# Patient Record
Sex: Male | Born: 1960 | Race: White | Hispanic: No | Marital: Married | Smoking: Former smoker
Health system: Southern US, Community
[De-identification: ages and names within clinical notes are randomized; demographics above are authoritative.]

## PROBLEM LIST (undated history)

## (undated) DIAGNOSIS — H532 Diplopia: Secondary | ICD-10-CM

## (undated) DIAGNOSIS — M62838 Other muscle spasm: Secondary | ICD-10-CM

## (undated) DIAGNOSIS — E119 Type 2 diabetes mellitus without complications: Secondary | ICD-10-CM

## (undated) DIAGNOSIS — G35 Multiple sclerosis: Secondary | ICD-10-CM

## (undated) HISTORY — DX: Other muscle spasm: M62.838

## (undated) HISTORY — DX: Multiple sclerosis: G35

## (undated) HISTORY — DX: Diplopia: H53.2

## (undated) HISTORY — PX: APPENDECTOMY: SHX54

## (undated) HISTORY — DX: Type 2 diabetes mellitus without complications: E11.9

## (undated) HISTORY — PX: OTHER SURGICAL HISTORY: SHX169

---

## 1989-05-23 DIAGNOSIS — G35 Multiple sclerosis: Secondary | ICD-10-CM

## 1989-05-23 HISTORY — DX: Multiple sclerosis: G35

## 2014-09-19 DIAGNOSIS — E119 Type 2 diabetes mellitus without complications: Secondary | ICD-10-CM | POA: Diagnosis not present

## 2014-10-01 DIAGNOSIS — G35 Multiple sclerosis: Secondary | ICD-10-CM | POA: Diagnosis not present

## 2014-10-01 DIAGNOSIS — E119 Type 2 diabetes mellitus without complications: Secondary | ICD-10-CM | POA: Diagnosis not present

## 2014-10-01 DIAGNOSIS — Z1389 Encounter for screening for other disorder: Secondary | ICD-10-CM | POA: Diagnosis not present

## 2014-10-27 DIAGNOSIS — G35 Multiple sclerosis: Secondary | ICD-10-CM | POA: Diagnosis not present

## 2014-10-27 DIAGNOSIS — G629 Polyneuropathy, unspecified: Secondary | ICD-10-CM | POA: Diagnosis not present

## 2014-12-01 DIAGNOSIS — E119 Type 2 diabetes mellitus without complications: Secondary | ICD-10-CM | POA: Diagnosis not present

## 2014-12-01 DIAGNOSIS — G35 Multiple sclerosis: Secondary | ICD-10-CM | POA: Diagnosis not present

## 2015-04-02 DIAGNOSIS — E119 Type 2 diabetes mellitus without complications: Secondary | ICD-10-CM | POA: Diagnosis not present

## 2015-04-02 DIAGNOSIS — G35 Multiple sclerosis: Secondary | ICD-10-CM | POA: Diagnosis not present

## 2015-04-02 DIAGNOSIS — R634 Abnormal weight loss: Secondary | ICD-10-CM | POA: Diagnosis not present

## 2015-08-12 DIAGNOSIS — E119 Type 2 diabetes mellitus without complications: Secondary | ICD-10-CM | POA: Diagnosis not present

## 2015-08-12 DIAGNOSIS — G35 Multiple sclerosis: Secondary | ICD-10-CM | POA: Diagnosis not present

## 2015-08-12 DIAGNOSIS — R634 Abnormal weight loss: Secondary | ICD-10-CM | POA: Diagnosis not present

## 2015-08-20 DIAGNOSIS — E119 Type 2 diabetes mellitus without complications: Secondary | ICD-10-CM | POA: Diagnosis not present

## 2015-08-20 DIAGNOSIS — G35 Multiple sclerosis: Secondary | ICD-10-CM | POA: Diagnosis not present

## 2015-08-20 DIAGNOSIS — Z6825 Body mass index (BMI) 25.0-25.9, adult: Secondary | ICD-10-CM | POA: Diagnosis not present

## 2015-12-29 DIAGNOSIS — E119 Type 2 diabetes mellitus without complications: Secondary | ICD-10-CM | POA: Diagnosis not present

## 2015-12-29 DIAGNOSIS — R634 Abnormal weight loss: Secondary | ICD-10-CM | POA: Diagnosis not present

## 2015-12-31 DIAGNOSIS — Z1389 Encounter for screening for other disorder: Secondary | ICD-10-CM | POA: Diagnosis not present

## 2015-12-31 DIAGNOSIS — E119 Type 2 diabetes mellitus without complications: Secondary | ICD-10-CM | POA: Diagnosis not present

## 2015-12-31 DIAGNOSIS — Z6824 Body mass index (BMI) 24.0-24.9, adult: Secondary | ICD-10-CM | POA: Diagnosis not present

## 2015-12-31 DIAGNOSIS — G35 Multiple sclerosis: Secondary | ICD-10-CM | POA: Diagnosis not present

## 2016-05-11 DIAGNOSIS — G35 Multiple sclerosis: Secondary | ICD-10-CM | POA: Diagnosis not present

## 2016-05-11 DIAGNOSIS — E119 Type 2 diabetes mellitus without complications: Secondary | ICD-10-CM | POA: Diagnosis not present

## 2016-05-11 DIAGNOSIS — Z6824 Body mass index (BMI) 24.0-24.9, adult: Secondary | ICD-10-CM | POA: Diagnosis not present

## 2016-08-31 DIAGNOSIS — R634 Abnormal weight loss: Secondary | ICD-10-CM | POA: Diagnosis not present

## 2016-08-31 DIAGNOSIS — G35 Multiple sclerosis: Secondary | ICD-10-CM | POA: Diagnosis not present

## 2016-08-31 DIAGNOSIS — E119 Type 2 diabetes mellitus without complications: Secondary | ICD-10-CM | POA: Diagnosis not present

## 2016-09-02 DIAGNOSIS — E119 Type 2 diabetes mellitus without complications: Secondary | ICD-10-CM | POA: Diagnosis not present

## 2016-09-09 DIAGNOSIS — E119 Type 2 diabetes mellitus without complications: Secondary | ICD-10-CM | POA: Diagnosis not present

## 2016-09-09 DIAGNOSIS — H527 Unspecified disorder of refraction: Secondary | ICD-10-CM | POA: Diagnosis not present

## 2016-09-09 DIAGNOSIS — H4921 Sixth [abducent] nerve palsy, right eye: Secondary | ICD-10-CM | POA: Diagnosis not present

## 2016-09-09 DIAGNOSIS — H532 Diplopia: Secondary | ICD-10-CM | POA: Diagnosis not present

## 2016-09-12 ENCOUNTER — Other Ambulatory Visit: Payer: Self-pay | Admitting: Ophthalmology

## 2016-09-12 DIAGNOSIS — G35 Multiple sclerosis: Secondary | ICD-10-CM

## 2016-09-12 DIAGNOSIS — H532 Diplopia: Secondary | ICD-10-CM

## 2016-09-12 DIAGNOSIS — H4921 Sixth [abducent] nerve palsy, right eye: Secondary | ICD-10-CM

## 2016-09-12 DIAGNOSIS — E119 Type 2 diabetes mellitus without complications: Secondary | ICD-10-CM | POA: Diagnosis not present

## 2016-09-12 DIAGNOSIS — Z1389 Encounter for screening for other disorder: Secondary | ICD-10-CM | POA: Diagnosis not present

## 2016-09-26 ENCOUNTER — Ambulatory Visit (INDEPENDENT_AMBULATORY_CARE_PROVIDER_SITE_OTHER): Payer: Medicare Other | Admitting: Neurology

## 2016-09-26 ENCOUNTER — Encounter: Payer: Self-pay | Admitting: Neurology

## 2016-09-26 ENCOUNTER — Encounter (INDEPENDENT_AMBULATORY_CARE_PROVIDER_SITE_OTHER): Payer: Self-pay

## 2016-09-26 DIAGNOSIS — G35 Multiple sclerosis: Secondary | ICD-10-CM | POA: Diagnosis not present

## 2016-09-26 NOTE — Progress Notes (Signed)
PATIENT: Steven Buchanan DOB: 06-02-1960  Chief Complaint  Patient presents with  . MS/Diplopia    States he was diagnosed with MS in 1991 but has never been on any medications. Reports new onset of double vision three weeks ago that has improved since taking a six day Prednisone dose pack.  Marland Kitchen PCP    Lovey Newcomer, PA  . Optometry    Smitty Cords, OD     HISTORICAL  Steven Buchanan 56 years old right-handed male, seen in refer by his ophthalmologist Dr. Smitty Cords for evaluation of double vision, his primary care physician is PA Daria Pastures. initial evaluation is on Sep 26 2016.  He had a past medical history of relapsing remitting multiple sclerosis, diagnosis was made in 1988, he presented with numbness from chest down, needle prick sensation at bilateral lower extremity, mild gait abnormality, mild bilateral hands paresthesia. Per patient, there was abnormal MRI of the cervical, and thoracic spine, patient decided not to take any long term immunomodulation therapy.  Over the past 30 years, he had occasionally flareup, usually it is similar symptoms of paresthesia from trunk down, lasting 1-2 weeks, from 2008 to 2012, he was followed by local neurologist Dr.Tesafay, per patient, he was treated with one round of by mouth steroid on a yearly basis because his flareups,  He never had visual problems associated with his MS, intermittent of April 2018, while driving his mother to [place], he noticed double vision, worse looking to the right and straight ahead, best looking to the left side, he self treated with tapering dose of prednisone with some improvement, at the same time, he notice recurrent bilateral lower abdomen muscle spasm paresthesia, increased bilateral lower extremity paresthesia, he denies bowel and bladder incontinence.  Patient reported normal laboratory evaluation recently, he was diagnosed with diabetes type 2 since 2015 is under good control.   REVIEW OF SYSTEMS:  Full 14 system review of systems performed and notable only for double vision, feeling hot, headache, numbness, weakness.   ALLERGIES: Allergies  Allergen Reactions  . Benadryl [Diphenhydramine]     tremors    HOME MEDICATIONS: Current Outpatient Prescriptions  Medication Sig Dispense Refill  . gabapentin (NEURONTIN) 100 MG capsule Take 100 mg by mouth 2 (two) times daily.  1  . metFORMIN (GLUCOPHAGE-XR) 500 MG 24 hr tablet Take 1,000 mg by mouth daily.  3  . omeprazole (PRILOSEC) 20 MG capsule Take 20 mg by mouth daily.    Marland Kitchen tiZANidine (ZANAFLEX) 4 MG capsule Take 4 mg by mouth as needed for muscle spasms.    . traMADol (ULTRAM) 50 MG tablet Take 50 mg by mouth as needed.  0   No current facility-administered medications for this visit.     PAST MEDICAL HISTORY: Past Medical History:  Diagnosis Date  . Diabetes (HCC)   . Double vision   . Multiple sclerosis (HCC) 1991  . Muscle spasm     PAST SURGICAL HISTORY: Past Surgical History:  Procedure Laterality Date  . APPENDECTOMY    . kidney stones      FAMILY HISTORY: Family History  Problem Relation Age of Onset  . Leukemia Mother   . Alzheimer's disease Father   . Diabetes Father     SOCIAL HISTORY:  Social History   Social History  . Marital status: Married    Spouse name: N/A  . Number of children: 4  . Years of education: some college   Occupational History  . Disabled  Social History Main Topics  . Smoking status: Former Games developer  . Smokeless tobacco: Never Used     Comment: Quit 20+ years ago  . Alcohol use Yes     Comment: Casual beer  . Drug use: No  . Sexual activity: Not on file   Other Topics Concern  . Not on file   Social History Narrative   Lives at home with wife, children and his mother.   Right-handed.   1 cup caffeine per day.     PHYSICAL EXAM   Vitals:   09/26/16 1604  BP: 139/79  Pulse: 73  Weight: 147 lb 4 oz (66.8 kg)  Height: 5\' 5"  (1.651 m)    Not recorded       Body mass index is 24.5 kg/m.  PHYSICAL EXAMNIATION:  Gen: NAD, conversant, well nourised, obese, well groomed                     Cardiovascular: Regular rate rhythm, no peripheral edema, warm, nontender. Eyes: Conjunctivae clear without exudates or hemorrhage Neck: Supple, no carotid bruits. Pulmonary: Clear to auscultation bilaterally   NEUROLOGICAL EXAM:  MENTAL STATUS: Speech:    Speech is normal; fluent and spontaneous with normal comprehension.  Cognition:     Orientation to time, place and person     Normal recent and remote memory     Normal Attention span and concentration     Normal Language, naming, repeating,spontaneous speech     Fund of knowledge   CRANIAL NERVES: CN II: Visual fields are full to confrontation. Fundoscopic exam is normal with sharp discs and no vascular changes. Pupils are round equal and briskly reactive to light. CN III, IV, VI: extraocular movement are normal. No ptosis. CN V: Facial sensation is intact to pinprick in all 3 divisions bilaterally. Corneal responses are intact.  CN VII: Face is symmetric with normal eye closure and smile. CN VIII: Hearing is normal to rubbing fingers CN IX, X: Palate elevates symmetrically. Phonation is normal. CN XI: Head turning and shoulder shrug are intact CN XII: Tongue is midline with normal movements and no atrophy.  MOTOR: There is no pronator drift of out-stretched arms. Muscle bulk and tone are normal. Muscle strength is normal.  REFLEXES: Reflexes are 2+ and symmetric at the biceps, triceps,Decreased at bilateral  knees, and ankles. Plantar responses are flexor.  SENSORY: Intact to light touch, pinprick, positional sensation and vibratory sensation are intact in fingers and toes.  COORDINATION: Rapid alternating movements and fine finger movements are intact. There is no dysmetria on finger-to-nose and heel-knee-shin.    GAIT/STANCE: Posture is normal. Gait is steady with normal steps,  base, arm swing, and turning. Heel and toe walking are normal. Tandem gait is normal.  Romberg is absent.   DIAGNOSTIC DATA (LABS, IMAGING, TESTING) - I reviewed patient records, labs, notes, testing and imaging myself where available.   ASSESSMENT AND PLAN  Marzell Isakson is a 56 y.o. male   Relapsing remitting multiple sclerosis  MRI of the brain, cervical and thoracic spine with and without contrast,  Will return to clinic in 3 -4 weeks, may consider long-term immunomodulation therapy   Levert Feinstein, M.D. Ph.D.  [facility] [address], [address] [place], [place] [address] Ph: [phone] Fax: [phone]  CC: Referring Provider

## 2016-09-28 ENCOUNTER — Telehealth: Payer: Self-pay | Admitting: Neurology

## 2016-09-28 NOTE — Telephone Encounter (Signed)
I scheduled his MRI's for 10/05/16 at 10/07/16 at [facility].. And they informed me that Dr. Terrace Arabia needs to e-sign the orders.Marland Kitchen

## 2016-09-28 NOTE — Telephone Encounter (Signed)
Noted, thank you

## 2016-10-05 ENCOUNTER — Ambulatory Visit (HOSPITAL_COMMUNITY)
Admission: RE | Admit: 2016-10-05 | Discharge: 2016-10-05 | Disposition: A | Discharge status: Home / Self Care | Payer: Medicare Other | Source: Ambulatory Visit | Attending: Neurology | Admitting: Neurology

## 2016-10-05 ENCOUNTER — Telehealth: Payer: Self-pay | Admitting: Neurology

## 2016-10-05 DIAGNOSIS — M4802 Spinal stenosis, cervical region: Secondary | ICD-10-CM | POA: Insufficient documentation

## 2016-10-05 DIAGNOSIS — G35 Multiple sclerosis: Secondary | ICD-10-CM | POA: Diagnosis not present

## 2016-10-05 DIAGNOSIS — R5383 Other fatigue: Secondary | ICD-10-CM | POA: Diagnosis not present

## 2016-10-05 LAB — POCT I-STAT CREATININE: Creatinine, Ser: 0.8 mg/dL (ref 0.61–1.24)

## 2016-10-05 MED ORDER — GADOBENATE DIMEGLUMINE 529 MG/ML IV SOLN
15.0000 mL | Freq: Once | INTRAVENOUS | Status: AC | PRN
  Administered 2016-10-05: 13 mL via INTRAVENOUS

## 2016-10-05 NOTE — Telephone Encounter (Signed)
Please call patient, MRI of the brain showed stable MS lesion, MRI of the cervical and upper thoracic spine showed chronic demyelinating lesions, Keep follow-up appointment will review MRIs, discussed treatment options.    1. No acute cervical or upper thoracic spinal cord demyelination identified. Stable to mild progression of chronic focal spinal cord lesions at C2-C3 and C7-T1 since 2008. 2. 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 3. Superimposed cervical spine disc degeneration. 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

## 2016-10-05 NOTE — Telephone Encounter (Signed)
Spoke to patient - he is aware of results and will keep his pending appt on 10/18/16 for further review.

## 2016-10-07 ENCOUNTER — Ambulatory Visit (HOSPITAL_COMMUNITY): Payer: Medicare Other

## 2016-10-18 ENCOUNTER — Encounter: Payer: Self-pay | Admitting: Neurology

## 2016-10-18 ENCOUNTER — Ambulatory Visit (INDEPENDENT_AMBULATORY_CARE_PROVIDER_SITE_OTHER): Payer: Medicare Other | Admitting: Neurology

## 2016-10-18 VITALS — BP 116/69 | HR 59 | Ht 65.0 in | Wt 146.0 lb

## 2016-10-18 DIAGNOSIS — E538 Deficiency of other specified B group vitamins: Secondary | ICD-10-CM | POA: Diagnosis not present

## 2016-10-18 DIAGNOSIS — G35 Multiple sclerosis: Secondary | ICD-10-CM | POA: Diagnosis not present

## 2016-10-18 DIAGNOSIS — R799 Abnormal finding of blood chemistry, unspecified: Secondary | ICD-10-CM | POA: Diagnosis not present

## 2016-10-18 DIAGNOSIS — E559 Vitamin D deficiency, unspecified: Secondary | ICD-10-CM | POA: Diagnosis not present

## 2016-10-18 DIAGNOSIS — R52 Pain, unspecified: Secondary | ICD-10-CM | POA: Diagnosis not present

## 2016-10-18 MED ORDER — TIZANIDINE HCL 4 MG PO CAPS: 4.0000 mg | ORAL_CAPSULE | ORAL | 6 refills | Status: AC | PRN

## 2016-10-18 NOTE — Patient Instructions (Signed)
[email or web address]  Gilenya (fingolimod) Tecfidera (dimethyl fumarate)

## 2016-10-18 NOTE — Progress Notes (Signed)
PATIENT: Steven Buchanan DOB: [birth year]  Chief Complaint  Patient presents with  . Multiple Sclerosis    He is here to review his MRI results.     HISTORICAL  Steven Buchanan 56 years old right-handed male, seen in refer by his ophthalmologist Dr. Daphine Deutscher, Amalia Hailey for evaluation of double vision, his primary care physician is PA Daria Pastures. initial evaluation is on Sep 26 2016.  He had a past medical history of relapsing remitting multiple sclerosis, diagnosis was made in 1988, he presented with numbness from chest down, needle prick sensation at bilateral lower extremity, mild gait abnormality, mild bilateral hands paresthesia. Per patient, there was abnormal MRI of the cervical, and thoracic spine, patient decided not to take any long term immunomodulation therapy.  Over the past 30 years, he had occasionally flareup, usually it is similar symptoms of paresthesia from trunk down, lasting 1-2 weeks, from 2008 to 2012, he was followed by local neurologist Dr.Tesafay, per patient, he was treated with one round of by mouth steroid on a yearly basis because his flareups,  He never had visual problems associated with his MS, intermittent of April 2018, while driving his mother to [place], he noticed double vision, worse looking to the right and straight ahead, best looking to the left side, he self treated with tapering dose of prednisone with some improvement, at the same time, he notice recurrent bilateral lower abdomen muscle spasm paresthesia, increased bilateral lower extremity paresthesia, he denies bowel and bladder incontinence.  Patient reported normal laboratory evaluation recently, he was diagnosed with diabetes type 2 since 2015 is under good control.  Updated Oct 18 2013:  His double vision has improved, he is now back to his baseline,  We have personally reviewed MRI of the brain with and without contrast on Oct 05 2016: Scattered nonspecific white matter signal abnormality,  stable without progression since 2008, no enhancement.  MRI of cervical spine with and without contrast showed mild progression of chronic focal spinal cord lesion at C2-3, C7-T1 since 2000 oh 7, superimposed bilateral abnormally thickened and enhancing cervical nerve roots and nerves, MRI of thoracic spine, Patchy heterogeneity within the thoracic spinal cord ,evidence of superimposed a small vascular malformation along surface of lower thoracic spinal cord at T10 and 11, possible abnormal thickening of proximal pulmonary crater nerve roots,   REVIEW OF SYSTEMS: Full 14 system review of systems performed and notable only for headache, numbness  ALLERGIES: Allergies  Allergen Reactions  . Benadryl [Diphenhydramine]     tremors    HOME MEDICATIONS: Current Outpatient Prescriptions  Medication Sig Dispense Refill  . gabapentin (NEURONTIN) 100 MG capsule Take 100 mg by mouth 2 (two) times daily.  1  . metFORMIN (GLUCOPHAGE-XR) 500 MG 24 hr tablet Take 1,000 mg by mouth daily.  3  . omeprazole (PRILOSEC) 20 MG capsule Take 20 mg by mouth daily.    Marland Kitchen tiZANidine (ZANAFLEX) 4 MG capsule Take 4 mg by mouth as needed for muscle spasms.    . traMADol (ULTRAM) 50 MG tablet Take 50 mg by mouth as needed.  0   No current facility-administered medications for this visit.     PAST MEDICAL HISTORY: Past Medical History:  Diagnosis Date  . Diabetes (HCC)   . Double vision   . Multiple sclerosis (HCC) 1991  . Muscle spasm     PAST SURGICAL HISTORY: Past Surgical History:  Procedure Laterality Date  . APPENDECTOMY    . kidney stones  FAMILY HISTORY: Family History  Problem Relation Age of Onset  . Leukemia Mother   . Alzheimer's disease Father   . Diabetes Father     SOCIAL HISTORY:  Social History   Social History  . Marital status: Married    Spouse name: N/A  . Number of children: 4  . Years of education: some college   Occupational History  . Disabled    Social  History Main Topics  . Smoking status: Former Games developer  . Smokeless tobacco: Never Used     Comment: Quit 20+ years ago  . Alcohol use Yes     Comment: Casual beer  . Drug use: No  . Sexual activity: Not on file   Other Topics Concern  . Not on file   Social History Narrative   Lives at home with wife, children and his mother.   Right-handed.   1 cup caffeine per day.     PHYSICAL EXAM   Vitals:   10/18/16 1201  BP: 116/69  Pulse: (!) 59  Weight: 146 lb (66.2 kg)  Height: 5\' 5"  (1.651 m)    Not recorded      Body mass index is 24.3 kg/m.  PHYSICAL EXAMNIATION:  Gen: NAD, conversant, well nourised, obese, well groomed                     Cardiovascular: Regular rate rhythm, no peripheral edema, warm, nontender. Eyes: Conjunctivae clear without exudates or hemorrhage Neck: Supple, no carotid bruits. Pulmonary: Clear to auscultation bilaterally   NEUROLOGICAL EXAM:  MENTAL STATUS: Speech:    Speech is normal; fluent and spontaneous with normal comprehension.  Cognition:     Orientation to time, place and person     Normal recent and remote memory     Normal Attention span and concentration     Normal Language, naming, repeating,spontaneous speech     Fund of knowledge   CRANIAL NERVES: CN II: Visual fields are full to confrontation. Fundoscopic exam is normal with sharp discs and no vascular changes. Pupils are round equal and briskly reactive to light. CN III, IV, VI: extraocular movement are normal. No ptosis. CN V: Facial sensation is intact to pinprick in all 3 divisions bilaterally. Corneal responses are intact.  CN VII: Face is symmetric with normal eye closure and smile. CN VIII: Hearing is normal to rubbing fingers CN IX, X: Palate elevates symmetrically. Phonation is normal. CN XI: Head turning and shoulder shrug are intact CN XII: Tongue is midline with normal movements and no atrophy.  MOTOR: There is no pronator drift of out-stretched arms.  Muscle bulk and tone are normal. Muscle strength is normal.  REFLEXES: Reflexes are 2+ and symmetric at the biceps, triceps,Decreased at bilateral  knees, and ankles. Plantar responses are flexor.  SENSORY: Intact to light touch, pinprick, positional sensation and vibratory sensation are intact in fingers and toes.  COORDINATION: Rapid alternating movements and fine finger movements are intact. There is no dysmetria on finger-to-nose and heel-knee-shin.    GAIT/STANCE: Posture is normal. Gait is steady with normal steps, base, arm swing, and turning. Heel and toe walking are normal. Tandem gait is normal.  Romberg is absent.   DIAGNOSTIC DATA (LABS, IMAGING, TESTING) - I reviewed patient records, labs, notes, testing and imaging myself where available.   ASSESSMENT AND PLAN  Steven Buchanan is a 56 y.o. male   Relapsing remitting multiple sclerosis  Continued evidence of abnormal MRI of the brain, cervical, thoracic spine,  also evidence of combined spinal cord lesion and peripheral nerve root abnormal thickening  Proceed with spinal fluid testing  Laboratory evaluations  I also provide information of potential treatment option Gilenya and Tecfidera     Levert Feinstein, M.D. Ph.D.  [facility] [address] [place], [place] [address] Ph: [phone] Fax: [phone]  CC: Referring Provider

## 2016-10-20 LAB — COMPREHENSIVE METABOLIC PANEL
A/G RATIO: 1.5 (ref 1.2–2.2)
ALBUMIN: 4.3 g/dL (ref 3.5–5.5)
ALT: 22 IU/L (ref 0–44)
AST: 23 IU/L (ref 0–40)
Alkaline Phosphatase: 49 IU/L (ref 39–117)
BILIRUBIN TOTAL: 0.4 mg/dL (ref 0.0–1.2)
BUN / CREAT RATIO: 18 (ref 9–20)
BUN: 14 mg/dL (ref 6–24)
CALCIUM: 9.1 mg/dL (ref 8.7–10.2)
CO2: 26 mmol/L (ref 18–29)
Chloride: 104 mmol/L (ref 96–106)
Creatinine, Ser: 0.8 mg/dL (ref 0.76–1.27)
GFR calc non Af Amer: 98 mL/min/{1.73_m2} (ref >59)
GFR, EST AFRICAN AMERICAN: 114 mL/min/{1.73_m2} (ref >59)
Globulin, Total: 2.9 g/dL (ref 1.5–4.5)
Glucose: 103 mg/dL — ABNORMAL HIGH (ref 65–99)
Potassium: 4.9 mmol/L (ref 3.5–5.2)
Sodium: 144 mmol/L (ref 134–144)

## 2016-10-20 LAB — QUANTIFERON IN TUBE
QFT TB AG MINUS NIL VALUE: 0.01 IU/mL
QUANTIFERON MITOGEN VALUE: 7.5 IU/mL
QUANTIFERON NIL VALUE: 0.03 [IU]/mL
QUANTIFERON TB AG VALUE: 0.04 IU/mL
QUANTIFERON TB GOLD: NEGATIVE

## 2016-10-20 LAB — CBC WITH DIFFERENTIAL
BASOS: 1 %
Basophils Absolute: 0 10*3/uL (ref 0.0–0.2)
EOS (ABSOLUTE): 0.1 10*3/uL (ref 0.0–0.4)
EOS: 2 %
HEMATOCRIT: 45.6 % (ref 37.5–51.0)
Hemoglobin: 16.1 g/dL (ref 13.0–17.7)
IMMATURE GRANS (ABS): 0 10*3/uL (ref 0.0–0.1)
Immature Granulocytes: 0 %
LYMPHS: 25 %
Lymphocytes Absolute: 1.6 10*3/uL (ref 0.7–3.1)
MCH: 32.2 pg (ref 26.6–33.0)
MCHC: 35.3 g/dL (ref 31.5–35.7)
MCV: 91 fL (ref 79–97)
MONOCYTES: 10 %
Monocytes Absolute: 0.6 10*3/uL (ref 0.1–0.9)
NEUTROS PCT: 62 %
Neutrophils Absolute: 3.9 10*3/uL (ref 1.4–7.0)
RBC: 5 x10E6/uL (ref 4.14–5.80)
RDW: 13.3 % (ref 12.3–15.4)
WBC: 6.2 10*3/uL (ref 3.4–10.8)

## 2016-10-20 LAB — VITAMIN D 25 HYDROXY (VIT D DEFICIENCY, FRACTURES): VIT D 25 HYDROXY: 26.1 ng/mL — AB (ref 30.0–100.0)

## 2016-10-20 LAB — SEDIMENTATION RATE: SED RATE: 2 mm/h (ref 0–30)

## 2016-10-20 LAB — NEUROMYELITIS OPTICA AUTOAB, IGG: NMO IgG Autoantibodies: <1.5 U/mL (ref 0.0–3.0)

## 2016-10-20 LAB — RPR: RPR: NONREACTIVE

## 2016-10-20 LAB — C-REACTIVE PROTEIN: CRP: 1.1 mg/L (ref 0.0–4.9)

## 2016-10-20 LAB — B. BURGDORFI ANTIBODIES: Lyme IgG/IgM Ab: <0.91 {ISR} (ref 0.00–0.90)

## 2016-10-20 LAB — IMMUNOFIXATION ELECTROPHORESIS
IGG (IMMUNOGLOBIN G), SERUM: 1130 mg/dL (ref 700–1600)
IgA/Immunoglobulin A, Serum: 408 mg/dL — ABNORMAL HIGH (ref 90–386)
IgM (Immunoglobulin M), Srm: 95 mg/dL (ref 20–172)
Total Protein: 7.2 g/dL (ref 6.0–8.5)

## 2016-10-20 LAB — TSH: TSH: 1.87 u[IU]/mL (ref 0.450–4.500)

## 2016-10-20 LAB — HEPATITIS C ANTIBODY: Hep C Virus Ab: <0.1 s/co ratio (ref 0.0–0.9)

## 2016-10-20 LAB — QUANTIFERON TB GOLD ASSAY (BLOOD)

## 2016-10-20 LAB — HIV ANTIBODY (ROUTINE TESTING W REFLEX): HIV SCREEN 4TH GENERATION: NONREACTIVE

## 2016-10-20 LAB — COPPER, SERUM: Copper: 75 ug/dL (ref 72–166)

## 2016-10-20 LAB — VITAMIN B12: VITAMIN B 12: 385 pg/mL (ref 232–1245)

## 2016-10-20 LAB — ANA W/REFLEX: Anti Nuclear Antibody(ANA): NEGATIVE

## 2016-10-20 LAB — CK: Total CK: 204 U/L (ref 24–204)

## 2016-10-20 LAB — VARICELLA ZOSTER ANTIBODY, IGG: VARICELLA: 3630 {index} (ref >165)

## 2016-10-20 LAB — FOLATE: Folate: 16.7 ng/mL (ref >3.0)

## 2016-10-20 LAB — HEPATITIS B SURFACE ANTIBODY,QUALITATIVE: Hep B Surface Ab, Qual: NONREACTIVE

## 2016-10-20 LAB — HEPATITIS B SURFACE ANTIGEN: Hepatitis B Surface Ag: NEGATIVE

## 2016-10-26 ENCOUNTER — Other Ambulatory Visit (INDEPENDENT_AMBULATORY_CARE_PROVIDER_SITE_OTHER): Payer: Self-pay

## 2016-10-26 DIAGNOSIS — Z0289 Encounter for other administrative examinations: Secondary | ICD-10-CM

## 2017-01-24 DIAGNOSIS — R634 Abnormal weight loss: Secondary | ICD-10-CM | POA: Diagnosis not present

## 2017-01-24 DIAGNOSIS — E119 Type 2 diabetes mellitus without complications: Secondary | ICD-10-CM | POA: Diagnosis not present

## 2017-01-24 DIAGNOSIS — G35 Multiple sclerosis: Secondary | ICD-10-CM | POA: Diagnosis not present

## 2017-01-26 DIAGNOSIS — E119 Type 2 diabetes mellitus without complications: Secondary | ICD-10-CM | POA: Diagnosis not present

## 2017-01-26 DIAGNOSIS — G35 Multiple sclerosis: Secondary | ICD-10-CM | POA: Diagnosis not present

## 2017-01-26 DIAGNOSIS — Z6825 Body mass index (BMI) 25.0-25.9, adult: Secondary | ICD-10-CM | POA: Diagnosis not present

## 2017-04-06 ENCOUNTER — Ambulatory Visit: Payer: Medicare Other | Admitting: Neurology

## 2017-05-25 DIAGNOSIS — G35 Multiple sclerosis: Secondary | ICD-10-CM | POA: Diagnosis not present

## 2017-05-25 DIAGNOSIS — R634 Abnormal weight loss: Secondary | ICD-10-CM | POA: Diagnosis not present

## 2017-05-25 DIAGNOSIS — E119 Type 2 diabetes mellitus without complications: Secondary | ICD-10-CM | POA: Diagnosis not present

## 2017-05-31 DIAGNOSIS — E119 Type 2 diabetes mellitus without complications: Secondary | ICD-10-CM | POA: Diagnosis not present

## 2017-05-31 DIAGNOSIS — G35 Multiple sclerosis: Secondary | ICD-10-CM | POA: Diagnosis not present

## 2017-05-31 DIAGNOSIS — Z6824 Body mass index (BMI) 24.0-24.9, adult: Secondary | ICD-10-CM | POA: Diagnosis not present

## 2017-08-08 DIAGNOSIS — L989 Disorder of the skin and subcutaneous tissue, unspecified: Secondary | ICD-10-CM | POA: Diagnosis not present

## 2017-08-08 DIAGNOSIS — Z6825 Body mass index (BMI) 25.0-25.9, adult: Secondary | ICD-10-CM | POA: Diagnosis not present

## 2017-11-24 DIAGNOSIS — L989 Disorder of the skin and subcutaneous tissue, unspecified: Secondary | ICD-10-CM | POA: Diagnosis not present

## 2017-11-24 DIAGNOSIS — G35 Multiple sclerosis: Secondary | ICD-10-CM | POA: Diagnosis not present

## 2017-11-24 DIAGNOSIS — E119 Type 2 diabetes mellitus without complications: Secondary | ICD-10-CM | POA: Diagnosis not present

## 2017-11-24 DIAGNOSIS — R634 Abnormal weight loss: Secondary | ICD-10-CM | POA: Diagnosis not present

## 2017-11-29 DIAGNOSIS — Z0000 Encounter for general adult medical examination without abnormal findings: Secondary | ICD-10-CM | POA: Diagnosis not present

## 2017-11-29 DIAGNOSIS — Z6825 Body mass index (BMI) 25.0-25.9, adult: Secondary | ICD-10-CM | POA: Diagnosis not present

## 2017-11-29 DIAGNOSIS — E119 Type 2 diabetes mellitus without complications: Secondary | ICD-10-CM | POA: Diagnosis not present

## 2018-04-22 IMAGING — MR MR THORACIC SPINE WO/W CM
4 of 9 series · 11 of 48 positions shown · IV contrast (13ml Multihance)
Comparison: Cervical spine MRI from today reported separately.

CLINICAL DATA: 56-year-old male with relapsing remitting multiple
sclerosis. Double vision, fatigue and leg numbness for 1 month.

EXAM:
MRI THORACIC WITHOUT AND WITH CONTRAST
TECHNIQUE: Multiplanar and multiecho pulse sequences of the thoracic spine were
obtained without and with intravenous contrast.
CONTRAST:  13 mL MultiHance in conjunction with contrast enhanced
imaging of the brain and cervical spine reported separately.

[Series 7: T1 · sagittal · 4.0mm · 0.72mm/px · 3 of 13 slices shown (1 of 2)]
[im 1/13]
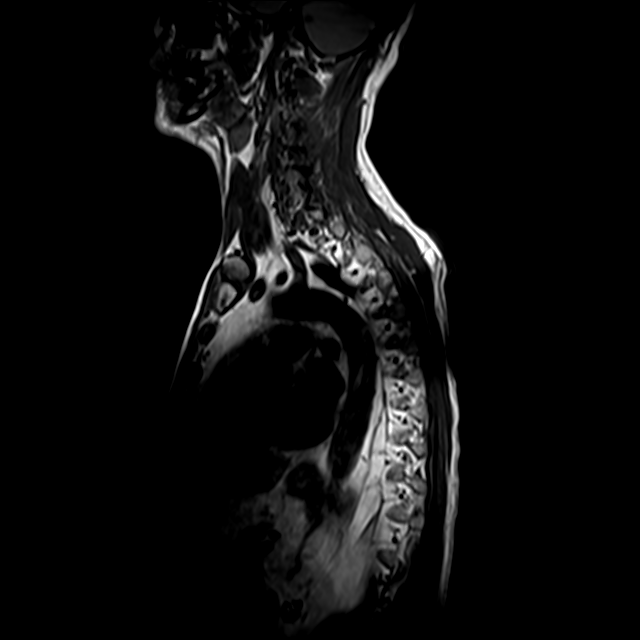
[im 9/13]
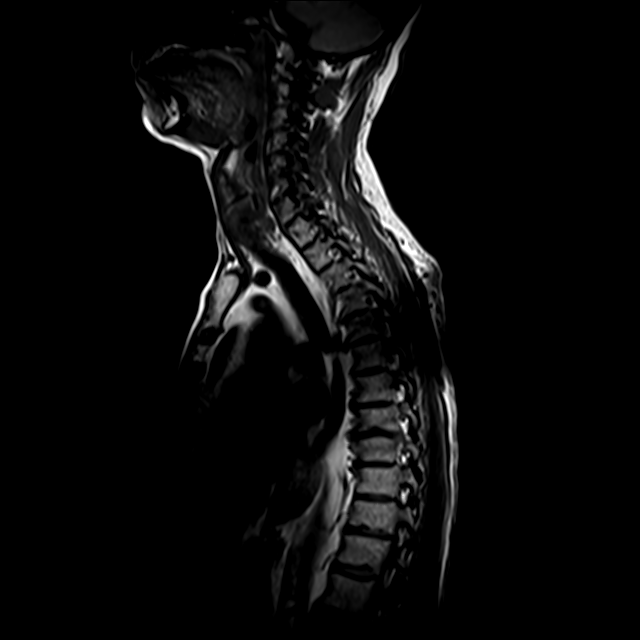
[im 13/13]
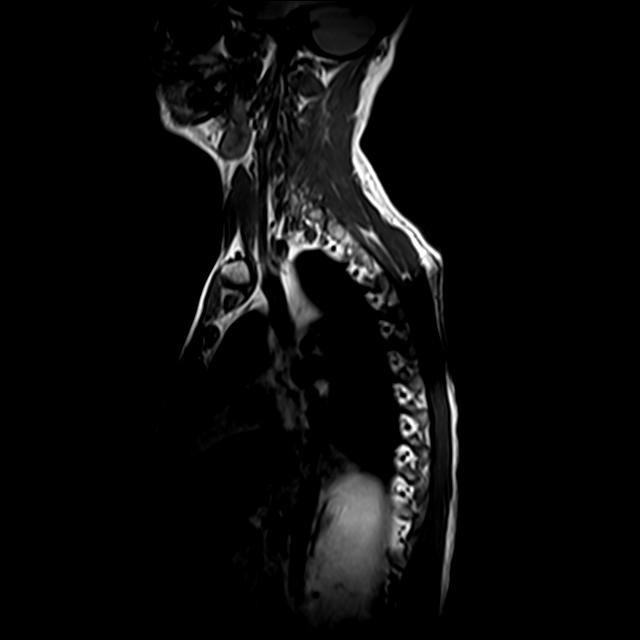

[Series 8: T1 · sagittal · 4.0mm · 0.38mm/px · 2 of 15 slices shown (2 of 2)]
[im 1/15]
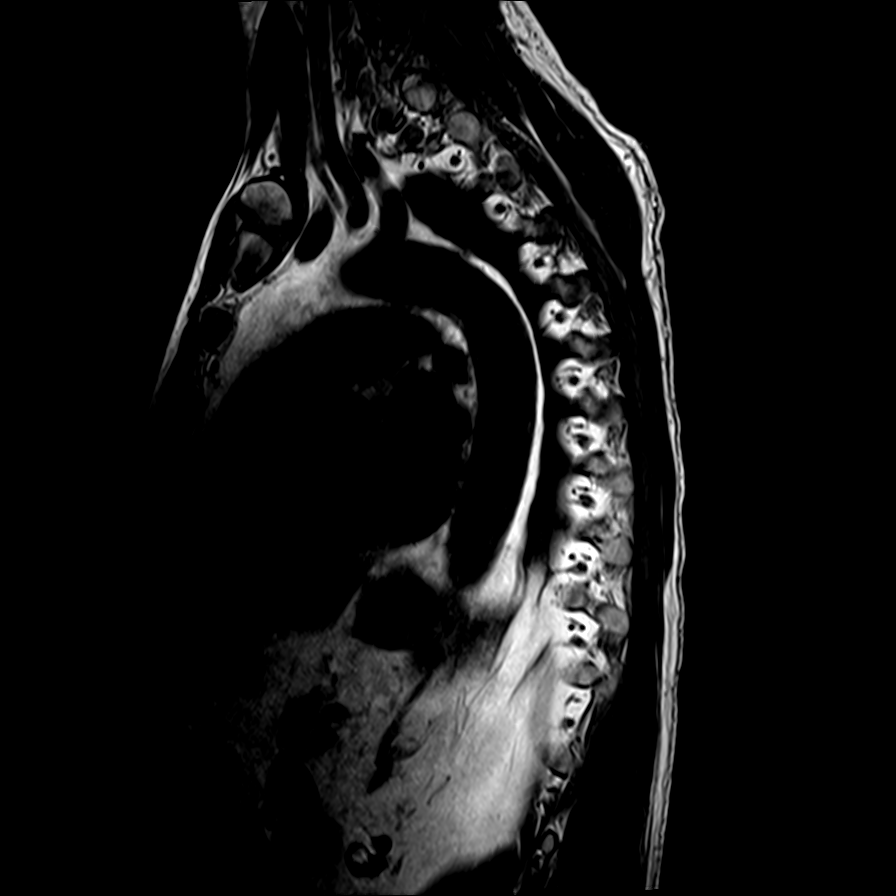
[im 8/15]
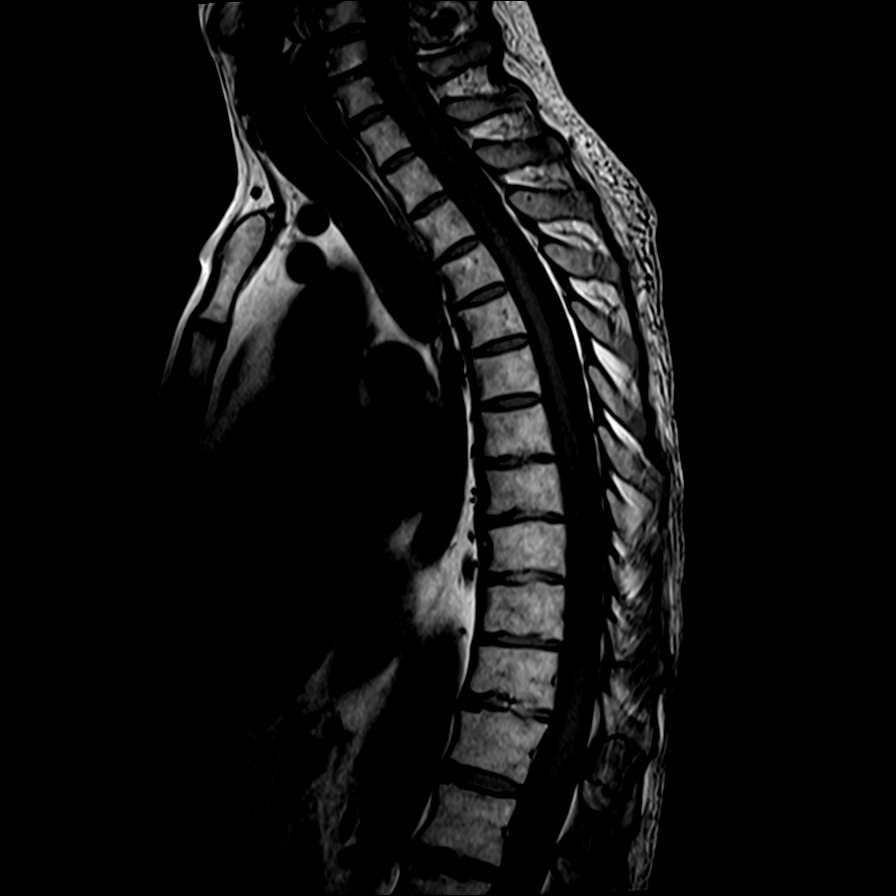

[Series 11: T2 · axial · 3.0mm · 0.19mm/px · z∈[-355,-211]mm · 3 of 35 slices shown (1 of 2)]
[im 5/35]
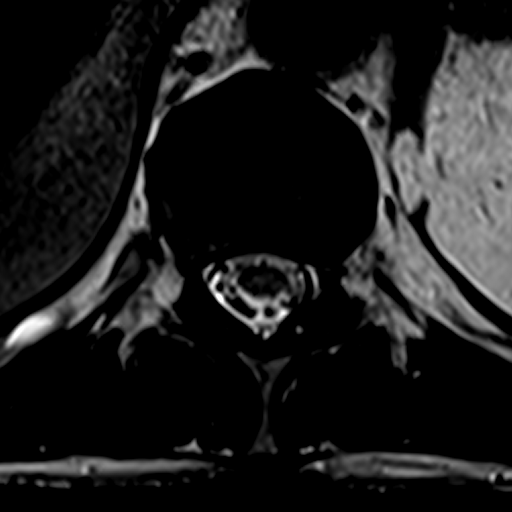
[im 20/35]
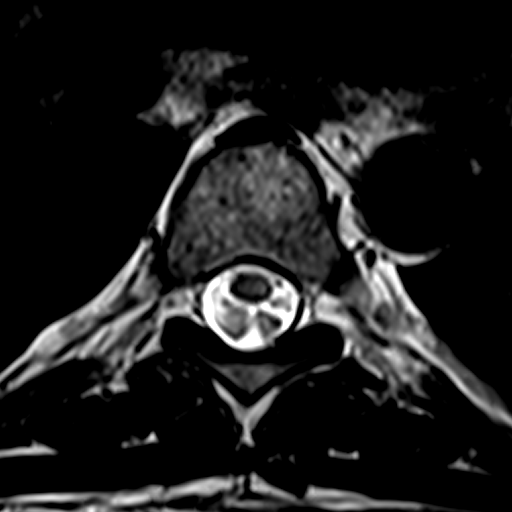
[im 30/35]
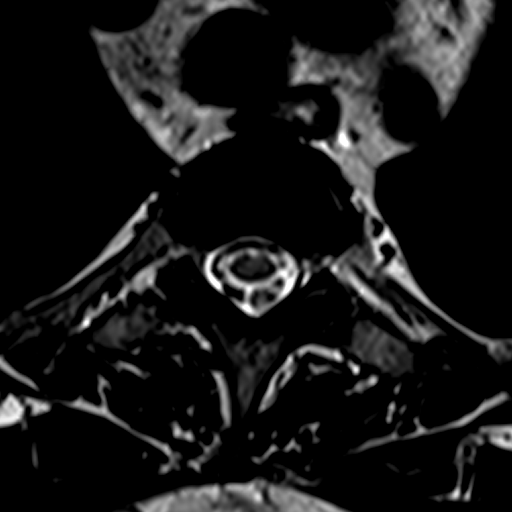

[Series 13: T2 · sagittal · 4.0mm · 0.59mm/px · 3 of 15 slices shown (2 of 2)]
[im 1/15]
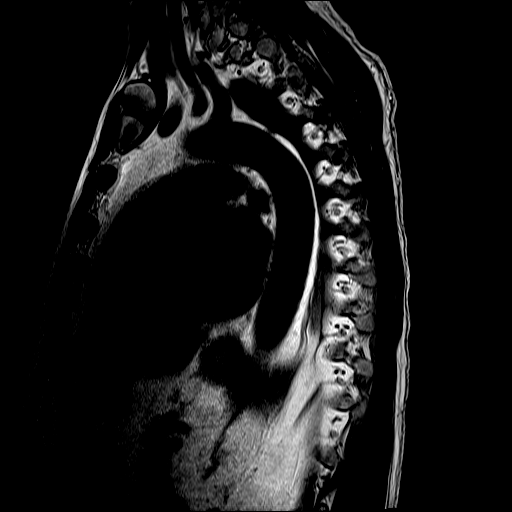
[im 8/15]
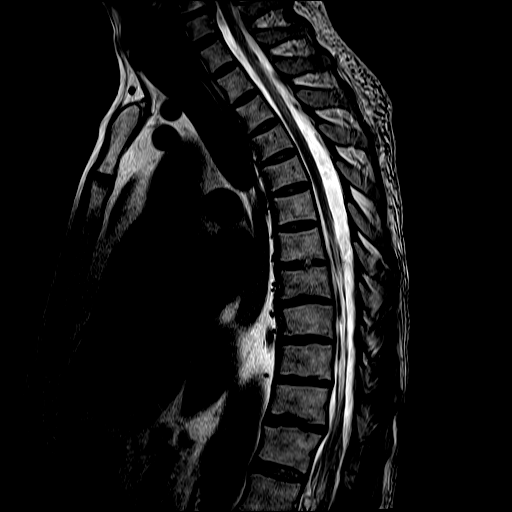
[im 15/15]
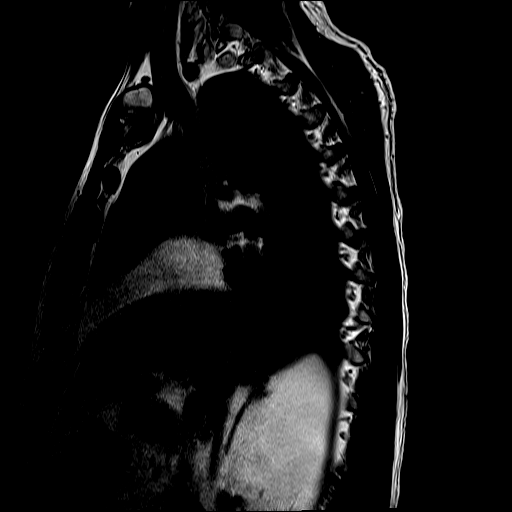

[11 of 48 positions shown; findings below may reference images not displayed]

FINDINGS: MRI THORACIC SPINE FINDINGS

Segmentation:  Appears normal.

Alignment: Thoracic vertebral height and alignment are within normal
limits.

Vertebrae: Visualized bone marrow signal is within normal limits. No
marrow edema or evidence of acute osseous abnormality.

Cord: Heterogeneity of the thoracic spinal cord STIR and T2 signal,
most apparent in the right hemi cord at T3-T4 (series 10, image 9).
No thoracic spinal cord expansion. No abnormal enhancement of the
substance of the thoracic spinal cord.

Abnormally thickened exiting T1 nerve roots noted on series 10,
image 1, in conjunction with the abnormal cervical nerve thickening
described on that study today. Occasional bilateral an intercostal
nerve thickening is noted, but in general the exiting thoracic nerve
roots are not abnormally enhancing.

However, there does appear to be mildly abnormal enhancement along
the surface of the lower thoracic spinal cord and conus as seen on
series 14, image 9. This appears to be vascular in nature, and note
the focal curvilinear enhancement along the left dorsal lateral cord
at T10-T11 on series 14, image 10. No definite spinal cord edema at
these levels.

But furthermore there may be abnormal thickening of the visible
cauda equina as seen on series 13, image 8. No definite cauda equina
enhancement (series 14, image 8).

Paraspinal and other soft tissues: Negative visualized thoracic and
upper abdominal viscera. Negative visualized posterior paraspinal
soft tissues.

Disc levels:

Capacious thoracic spinal cord. Age concordant lower thoracic disc
and facet degeneration with node degenerative thoracic spinal or
foraminal stenosis.
IMPRESSION: 1. Evidence of combined spinal cord and peripheral nerve
demyelinating disease as described on the cervical MRI today.
Patchy heterogeneity within the thoracic spinal cord with no acute
spinal cord demyelination identified.
Abnormally thickened upper thoracic nerves compatible with chronic
inflammatory demyelinating polyneuropathy (CIDP).
2. Possible abnormal thickening also of the proximal cauda equina
nerve roots, but no definite enhancement. Lumbar MRI without and
with contrast would confirm as necessary.
3. Evidence of a superimposed small vascular malformation along the
surface of the lower thoracic spinal cord at T10 and T11 (see series
14, images 9 and 10). No definite associated cord edema and the
clinical significance of this finding is unclear.

## 2018-04-26 DIAGNOSIS — L989 Disorder of the skin and subcutaneous tissue, unspecified: Secondary | ICD-10-CM | POA: Diagnosis not present

## 2018-04-26 DIAGNOSIS — G35 Multiple sclerosis: Secondary | ICD-10-CM | POA: Diagnosis not present

## 2018-04-26 DIAGNOSIS — R634 Abnormal weight loss: Secondary | ICD-10-CM | POA: Diagnosis not present

## 2018-04-26 DIAGNOSIS — E119 Type 2 diabetes mellitus without complications: Secondary | ICD-10-CM | POA: Diagnosis not present

## 2018-05-03 DIAGNOSIS — Z1389 Encounter for screening for other disorder: Secondary | ICD-10-CM | POA: Diagnosis not present

## 2018-05-03 DIAGNOSIS — G35 Multiple sclerosis: Secondary | ICD-10-CM | POA: Diagnosis not present

## 2018-05-03 DIAGNOSIS — E119 Type 2 diabetes mellitus without complications: Secondary | ICD-10-CM | POA: Diagnosis not present

## 2018-05-03 DIAGNOSIS — Z1331 Encounter for screening for depression: Secondary | ICD-10-CM | POA: Diagnosis not present

## 2018-05-03 DIAGNOSIS — Z6825 Body mass index (BMI) 25.0-25.9, adult: Secondary | ICD-10-CM | POA: Diagnosis not present

## 2018-08-19 DIAGNOSIS — L03116 Cellulitis of left lower limb: Secondary | ICD-10-CM | POA: Diagnosis not present

## 2018-08-23 DIAGNOSIS — R634 Abnormal weight loss: Secondary | ICD-10-CM | POA: Diagnosis not present

## 2018-08-23 DIAGNOSIS — L989 Disorder of the skin and subcutaneous tissue, unspecified: Secondary | ICD-10-CM | POA: Diagnosis not present

## 2018-08-23 DIAGNOSIS — G35 Multiple sclerosis: Secondary | ICD-10-CM | POA: Diagnosis not present

## 2018-08-23 DIAGNOSIS — E119 Type 2 diabetes mellitus without complications: Secondary | ICD-10-CM | POA: Diagnosis not present

## 2018-08-29 DIAGNOSIS — E1142 Type 2 diabetes mellitus with diabetic polyneuropathy: Secondary | ICD-10-CM | POA: Diagnosis not present

## 2018-08-29 DIAGNOSIS — L03116 Cellulitis of left lower limb: Secondary | ICD-10-CM | POA: Diagnosis not present

## 2018-08-29 DIAGNOSIS — G35 Multiple sclerosis: Secondary | ICD-10-CM | POA: Diagnosis not present

## 2018-08-29 DIAGNOSIS — T63441A Toxic effect of venom of bees, accidental (unintentional), initial encounter: Secondary | ICD-10-CM | POA: Diagnosis not present

## 2019-02-06 DIAGNOSIS — E119 Type 2 diabetes mellitus without complications: Secondary | ICD-10-CM | POA: Diagnosis not present

## 2019-02-06 DIAGNOSIS — R634 Abnormal weight loss: Secondary | ICD-10-CM | POA: Diagnosis not present

## 2019-02-12 DIAGNOSIS — E1142 Type 2 diabetes mellitus with diabetic polyneuropathy: Secondary | ICD-10-CM | POA: Diagnosis not present

## 2019-02-12 DIAGNOSIS — G35 Multiple sclerosis: Secondary | ICD-10-CM | POA: Diagnosis not present

## 2019-02-12 DIAGNOSIS — E782 Mixed hyperlipidemia: Secondary | ICD-10-CM | POA: Diagnosis not present

## 2019-03-05 DIAGNOSIS — Z23 Encounter for immunization: Secondary | ICD-10-CM | POA: Diagnosis not present

## 2019-07-04 DIAGNOSIS — E782 Mixed hyperlipidemia: Secondary | ICD-10-CM | POA: Diagnosis not present

## 2019-07-04 DIAGNOSIS — E1142 Type 2 diabetes mellitus with diabetic polyneuropathy: Secondary | ICD-10-CM | POA: Diagnosis not present

## 2019-07-08 DIAGNOSIS — E1142 Type 2 diabetes mellitus with diabetic polyneuropathy: Secondary | ICD-10-CM | POA: Diagnosis not present

## 2019-07-08 DIAGNOSIS — E782 Mixed hyperlipidemia: Secondary | ICD-10-CM | POA: Diagnosis not present

## 2019-07-08 DIAGNOSIS — G35 Multiple sclerosis: Secondary | ICD-10-CM | POA: Diagnosis not present

## 2019-07-08 DIAGNOSIS — Z6824 Body mass index (BMI) 24.0-24.9, adult: Secondary | ICD-10-CM | POA: Diagnosis not present

## 2019-10-11 DIAGNOSIS — E119 Type 2 diabetes mellitus without complications: Secondary | ICD-10-CM | POA: Diagnosis not present

## 2019-10-11 DIAGNOSIS — G35 Multiple sclerosis: Secondary | ICD-10-CM | POA: Diagnosis not present

## 2019-10-11 DIAGNOSIS — E782 Mixed hyperlipidemia: Secondary | ICD-10-CM | POA: Diagnosis not present

## 2019-10-11 DIAGNOSIS — E1142 Type 2 diabetes mellitus with diabetic polyneuropathy: Secondary | ICD-10-CM | POA: Diagnosis not present

## 2019-10-14 DIAGNOSIS — G35 Multiple sclerosis: Secondary | ICD-10-CM | POA: Diagnosis not present

## 2019-10-14 DIAGNOSIS — E782 Mixed hyperlipidemia: Secondary | ICD-10-CM | POA: Diagnosis not present

## 2019-10-14 DIAGNOSIS — Z6824 Body mass index (BMI) 24.0-24.9, adult: Secondary | ICD-10-CM | POA: Diagnosis not present

## 2019-10-14 DIAGNOSIS — E1142 Type 2 diabetes mellitus with diabetic polyneuropathy: Secondary | ICD-10-CM | POA: Diagnosis not present

## 2020-03-09 DIAGNOSIS — Z23 Encounter for immunization: Secondary | ICD-10-CM | POA: Diagnosis not present

## 2020-03-09 DIAGNOSIS — Z13220 Encounter for screening for lipoid disorders: Secondary | ICD-10-CM | POA: Diagnosis not present

## 2020-03-09 DIAGNOSIS — E782 Mixed hyperlipidemia: Secondary | ICD-10-CM | POA: Diagnosis not present

## 2020-03-09 DIAGNOSIS — E119 Type 2 diabetes mellitus without complications: Secondary | ICD-10-CM | POA: Diagnosis not present

## 2020-03-13 DIAGNOSIS — E1142 Type 2 diabetes mellitus with diabetic polyneuropathy: Secondary | ICD-10-CM | POA: Diagnosis not present

## 2020-03-13 DIAGNOSIS — G35 Multiple sclerosis: Secondary | ICD-10-CM | POA: Diagnosis not present

## 2020-03-13 DIAGNOSIS — E782 Mixed hyperlipidemia: Secondary | ICD-10-CM | POA: Diagnosis not present

## 2020-04-13 DIAGNOSIS — Z23 Encounter for immunization: Secondary | ICD-10-CM | POA: Diagnosis not present

## 2020-08-14 DIAGNOSIS — E1142 Type 2 diabetes mellitus with diabetic polyneuropathy: Secondary | ICD-10-CM | POA: Diagnosis not present

## 2020-08-14 DIAGNOSIS — Z13220 Encounter for screening for lipoid disorders: Secondary | ICD-10-CM | POA: Diagnosis not present

## 2020-08-14 DIAGNOSIS — R634 Abnormal weight loss: Secondary | ICD-10-CM | POA: Diagnosis not present

## 2020-08-14 DIAGNOSIS — E7849 Other hyperlipidemia: Secondary | ICD-10-CM | POA: Diagnosis not present

## 2020-08-14 DIAGNOSIS — E782 Mixed hyperlipidemia: Secondary | ICD-10-CM | POA: Diagnosis not present

## 2020-08-20 DIAGNOSIS — E7849 Other hyperlipidemia: Secondary | ICD-10-CM | POA: Diagnosis not present

## 2020-08-20 DIAGNOSIS — E1142 Type 2 diabetes mellitus with diabetic polyneuropathy: Secondary | ICD-10-CM | POA: Diagnosis not present

## 2020-08-20 DIAGNOSIS — G35 Multiple sclerosis: Secondary | ICD-10-CM | POA: Diagnosis not present

## 2021-01-29 DIAGNOSIS — E782 Mixed hyperlipidemia: Secondary | ICD-10-CM | POA: Diagnosis not present

## 2021-01-29 DIAGNOSIS — R739 Hyperglycemia, unspecified: Secondary | ICD-10-CM | POA: Diagnosis not present

## 2021-01-29 DIAGNOSIS — Z23 Encounter for immunization: Secondary | ICD-10-CM | POA: Diagnosis not present

## 2021-02-03 DIAGNOSIS — G35 Multiple sclerosis: Secondary | ICD-10-CM | POA: Diagnosis not present

## 2021-02-03 DIAGNOSIS — Z1389 Encounter for screening for other disorder: Secondary | ICD-10-CM | POA: Diagnosis not present

## 2021-02-03 DIAGNOSIS — Z1331 Encounter for screening for depression: Secondary | ICD-10-CM | POA: Diagnosis not present

## 2021-02-03 DIAGNOSIS — E1142 Type 2 diabetes mellitus with diabetic polyneuropathy: Secondary | ICD-10-CM | POA: Diagnosis not present

## 2021-02-03 DIAGNOSIS — Z6824 Body mass index (BMI) 24.0-24.9, adult: Secondary | ICD-10-CM | POA: Diagnosis not present

## 2021-03-14 DIAGNOSIS — Z23 Encounter for immunization: Secondary | ICD-10-CM | POA: Diagnosis not present

## 2021-07-09 DIAGNOSIS — E119 Type 2 diabetes mellitus without complications: Secondary | ICD-10-CM | POA: Diagnosis not present

## 2021-07-09 DIAGNOSIS — G35 Multiple sclerosis: Secondary | ICD-10-CM | POA: Diagnosis not present

## 2021-07-09 DIAGNOSIS — E782 Mixed hyperlipidemia: Secondary | ICD-10-CM | POA: Diagnosis not present

## 2021-07-09 DIAGNOSIS — E7849 Other hyperlipidemia: Secondary | ICD-10-CM | POA: Diagnosis not present

## 2021-07-15 DIAGNOSIS — Z6824 Body mass index (BMI) 24.0-24.9, adult: Secondary | ICD-10-CM | POA: Diagnosis not present

## 2021-07-15 DIAGNOSIS — E7849 Other hyperlipidemia: Secondary | ICD-10-CM | POA: Diagnosis not present

## 2021-07-15 DIAGNOSIS — E1142 Type 2 diabetes mellitus with diabetic polyneuropathy: Secondary | ICD-10-CM | POA: Diagnosis not present

## 2021-07-15 DIAGNOSIS — G35 Multiple sclerosis: Secondary | ICD-10-CM | POA: Diagnosis not present

## 2021-10-22 DIAGNOSIS — Z0000 Encounter for general adult medical examination without abnormal findings: Secondary | ICD-10-CM | POA: Diagnosis not present

## 2021-10-22 DIAGNOSIS — E1142 Type 2 diabetes mellitus with diabetic polyneuropathy: Secondary | ICD-10-CM | POA: Diagnosis not present

## 2021-10-26 DIAGNOSIS — I10 Essential (primary) hypertension: Secondary | ICD-10-CM | POA: Diagnosis not present

## 2021-10-26 DIAGNOSIS — Z6824 Body mass index (BMI) 24.0-24.9, adult: Secondary | ICD-10-CM | POA: Diagnosis not present

## 2021-10-26 DIAGNOSIS — E1142 Type 2 diabetes mellitus with diabetic polyneuropathy: Secondary | ICD-10-CM | POA: Diagnosis not present

## 2021-10-26 DIAGNOSIS — G35 Multiple sclerosis: Secondary | ICD-10-CM | POA: Diagnosis not present

## 2021-10-26 DIAGNOSIS — E7849 Other hyperlipidemia: Secondary | ICD-10-CM | POA: Diagnosis not present

## 2022-04-11 DIAGNOSIS — E119 Type 2 diabetes mellitus without complications: Secondary | ICD-10-CM | POA: Diagnosis not present

## 2022-04-11 DIAGNOSIS — R300 Dysuria: Secondary | ICD-10-CM | POA: Diagnosis not present

## 2022-04-11 DIAGNOSIS — R739 Hyperglycemia, unspecified: Secondary | ICD-10-CM | POA: Diagnosis not present

## 2022-04-18 DIAGNOSIS — Z23 Encounter for immunization: Secondary | ICD-10-CM | POA: Diagnosis not present

## 2022-04-18 DIAGNOSIS — G35 Multiple sclerosis: Secondary | ICD-10-CM | POA: Diagnosis not present

## 2022-04-18 DIAGNOSIS — E1142 Type 2 diabetes mellitus with diabetic polyneuropathy: Secondary | ICD-10-CM | POA: Diagnosis not present

## 2022-04-18 DIAGNOSIS — E7849 Other hyperlipidemia: Secondary | ICD-10-CM | POA: Diagnosis not present

## 2022-04-18 DIAGNOSIS — R030 Elevated blood-pressure reading, without diagnosis of hypertension: Secondary | ICD-10-CM | POA: Diagnosis not present

## 2022-04-18 DIAGNOSIS — Z6824 Body mass index (BMI) 24.0-24.9, adult: Secondary | ICD-10-CM | POA: Diagnosis not present

## 2022-04-26 DIAGNOSIS — Z23 Encounter for immunization: Secondary | ICD-10-CM | POA: Diagnosis not present

## 2022-07-29 DIAGNOSIS — E7849 Other hyperlipidemia: Secondary | ICD-10-CM | POA: Diagnosis not present

## 2022-07-29 DIAGNOSIS — R634 Abnormal weight loss: Secondary | ICD-10-CM | POA: Diagnosis not present

## 2022-07-29 DIAGNOSIS — E119 Type 2 diabetes mellitus without complications: Secondary | ICD-10-CM | POA: Diagnosis not present

## 2022-07-29 DIAGNOSIS — Z125 Encounter for screening for malignant neoplasm of prostate: Secondary | ICD-10-CM | POA: Diagnosis not present

## 2022-08-15 DIAGNOSIS — R030 Elevated blood-pressure reading, without diagnosis of hypertension: Secondary | ICD-10-CM | POA: Diagnosis not present

## 2022-08-15 DIAGNOSIS — E1142 Type 2 diabetes mellitus with diabetic polyneuropathy: Secondary | ICD-10-CM | POA: Diagnosis not present

## 2022-08-15 DIAGNOSIS — Z6823 Body mass index (BMI) 23.0-23.9, adult: Secondary | ICD-10-CM | POA: Diagnosis not present

## 2022-08-15 DIAGNOSIS — G35 Multiple sclerosis: Secondary | ICD-10-CM | POA: Diagnosis not present

## 2022-08-15 DIAGNOSIS — E7849 Other hyperlipidemia: Secondary | ICD-10-CM | POA: Diagnosis not present

## 2022-11-09 DIAGNOSIS — E119 Type 2 diabetes mellitus without complications: Secondary | ICD-10-CM | POA: Diagnosis not present

## 2022-11-09 DIAGNOSIS — G35 Multiple sclerosis: Secondary | ICD-10-CM | POA: Diagnosis not present

## 2022-11-14 DIAGNOSIS — R030 Elevated blood-pressure reading, without diagnosis of hypertension: Secondary | ICD-10-CM | POA: Diagnosis not present

## 2022-11-14 DIAGNOSIS — Z6823 Body mass index (BMI) 23.0-23.9, adult: Secondary | ICD-10-CM | POA: Diagnosis not present

## 2022-11-14 DIAGNOSIS — G35 Multiple sclerosis: Secondary | ICD-10-CM | POA: Diagnosis not present

## 2022-11-14 DIAGNOSIS — E1142 Type 2 diabetes mellitus with diabetic polyneuropathy: Secondary | ICD-10-CM | POA: Diagnosis not present

## 2022-11-14 DIAGNOSIS — E782 Mixed hyperlipidemia: Secondary | ICD-10-CM | POA: Diagnosis not present

## 2023-03-13 DIAGNOSIS — E119 Type 2 diabetes mellitus without complications: Secondary | ICD-10-CM | POA: Diagnosis not present

## 2023-03-13 DIAGNOSIS — R300 Dysuria: Secondary | ICD-10-CM | POA: Diagnosis not present

## 2023-03-13 DIAGNOSIS — E7849 Other hyperlipidemia: Secondary | ICD-10-CM | POA: Diagnosis not present

## 2023-03-13 DIAGNOSIS — G35 Multiple sclerosis: Secondary | ICD-10-CM | POA: Diagnosis not present

## 2023-03-14 DIAGNOSIS — E119 Type 2 diabetes mellitus without complications: Secondary | ICD-10-CM | POA: Diagnosis not present

## 2023-03-14 DIAGNOSIS — E782 Mixed hyperlipidemia: Secondary | ICD-10-CM | POA: Diagnosis not present

## 2023-03-20 DIAGNOSIS — R030 Elevated blood-pressure reading, without diagnosis of hypertension: Secondary | ICD-10-CM | POA: Diagnosis not present

## 2023-03-20 DIAGNOSIS — G35 Multiple sclerosis: Secondary | ICD-10-CM | POA: Diagnosis not present

## 2023-03-20 DIAGNOSIS — E1142 Type 2 diabetes mellitus with diabetic polyneuropathy: Secondary | ICD-10-CM | POA: Diagnosis not present

## 2023-03-20 DIAGNOSIS — E7849 Other hyperlipidemia: Secondary | ICD-10-CM | POA: Diagnosis not present

## 2023-03-20 DIAGNOSIS — Z6822 Body mass index (BMI) 22.0-22.9, adult: Secondary | ICD-10-CM | POA: Diagnosis not present

## 2023-04-14 DIAGNOSIS — R300 Dysuria: Secondary | ICD-10-CM | POA: Diagnosis not present

## 2023-05-04 DIAGNOSIS — H40033 Anatomical narrow angle, bilateral: Secondary | ICD-10-CM | POA: Diagnosis not present

## 2023-05-04 DIAGNOSIS — H2513 Age-related nuclear cataract, bilateral: Secondary | ICD-10-CM | POA: Diagnosis not present

## 2023-05-23 DIAGNOSIS — R300 Dysuria: Secondary | ICD-10-CM | POA: Diagnosis not present

## 2023-06-01 DIAGNOSIS — E119 Type 2 diabetes mellitus without complications: Secondary | ICD-10-CM | POA: Diagnosis not present

## 2023-06-01 DIAGNOSIS — R634 Abnormal weight loss: Secondary | ICD-10-CM | POA: Diagnosis not present

## 2023-06-12 DIAGNOSIS — E1165 Type 2 diabetes mellitus with hyperglycemia: Secondary | ICD-10-CM | POA: Diagnosis not present

## 2023-06-12 DIAGNOSIS — Z6824 Body mass index (BMI) 24.0-24.9, adult: Secondary | ICD-10-CM | POA: Diagnosis not present

## 2023-10-26 DIAGNOSIS — E1165 Type 2 diabetes mellitus with hyperglycemia: Secondary | ICD-10-CM | POA: Diagnosis not present

## 2023-10-26 DIAGNOSIS — Z125 Encounter for screening for malignant neoplasm of prostate: Secondary | ICD-10-CM | POA: Diagnosis not present

## 2023-10-26 DIAGNOSIS — E7849 Other hyperlipidemia: Secondary | ICD-10-CM | POA: Diagnosis not present

## 2023-10-26 DIAGNOSIS — R634 Abnormal weight loss: Secondary | ICD-10-CM | POA: Diagnosis not present

## 2023-10-26 DIAGNOSIS — E1142 Type 2 diabetes mellitus with diabetic polyneuropathy: Secondary | ICD-10-CM | POA: Diagnosis not present

## 2023-11-02 DIAGNOSIS — E119 Type 2 diabetes mellitus without complications: Secondary | ICD-10-CM | POA: Diagnosis not present

## 2023-11-02 DIAGNOSIS — E782 Mixed hyperlipidemia: Secondary | ICD-10-CM | POA: Diagnosis not present

## 2023-11-02 DIAGNOSIS — Z6823 Body mass index (BMI) 23.0-23.9, adult: Secondary | ICD-10-CM | POA: Diagnosis not present

## 2023-11-02 DIAGNOSIS — G35 Multiple sclerosis: Secondary | ICD-10-CM | POA: Diagnosis not present

## 2024-01-15 DIAGNOSIS — R300 Dysuria: Secondary | ICD-10-CM | POA: Diagnosis not present

## 2024-03-14 DIAGNOSIS — E1165 Type 2 diabetes mellitus with hyperglycemia: Secondary | ICD-10-CM | POA: Diagnosis not present

## 2024-03-14 DIAGNOSIS — Z130 Encounter for screening for diseases of the blood and blood-forming organs and certain disorders involving the immune mechanism: Secondary | ICD-10-CM | POA: Diagnosis not present

## 2024-03-14 DIAGNOSIS — E7849 Other hyperlipidemia: Secondary | ICD-10-CM | POA: Diagnosis not present

## 2024-03-21 DIAGNOSIS — G35D Multiple sclerosis, unspecified: Secondary | ICD-10-CM | POA: Diagnosis not present

## 2024-03-21 DIAGNOSIS — Z1211 Encounter for screening for malignant neoplasm of colon: Secondary | ICD-10-CM | POA: Diagnosis not present

## 2024-03-21 DIAGNOSIS — Z6824 Body mass index (BMI) 24.0-24.9, adult: Secondary | ICD-10-CM | POA: Diagnosis not present

## 2024-03-21 DIAGNOSIS — E119 Type 2 diabetes mellitus without complications: Secondary | ICD-10-CM | POA: Diagnosis not present

## 2024-04-26 NOTE — Progress Notes (Signed)
 Steven Buchanan                                          MRN: [id number]   04/26/2024   The [facility] Quality Team Specialist reviewed this patient medical record for the purposes of chart review for care gap closure. The following were reviewed: chart review for care gap closure-kidney health evaluation for diabetes:eGFR  and uACR.    [facility] Quality Team

## 2024-05-04 DIAGNOSIS — H40033 Anatomical narrow angle, bilateral: Secondary | ICD-10-CM | POA: Diagnosis not present

## 2024-05-04 DIAGNOSIS — H2513 Age-related nuclear cataract, bilateral: Secondary | ICD-10-CM | POA: Diagnosis not present
